# Patient Record
Sex: Male | Born: 1980 | Race: Black or African American | Hispanic: No | Marital: Married | Smoking: Never smoker
Health system: Southern US, Community
[De-identification: ages and names within clinical notes are randomized; demographics above are authoritative.]

## PROBLEM LIST (undated history)

## (undated) DIAGNOSIS — I10 Essential (primary) hypertension: Secondary | ICD-10-CM

---

## 2011-03-22 ENCOUNTER — Ambulatory Visit: Payer: Self-pay | Admitting: Internal Medicine

## 2011-03-22 DIAGNOSIS — Z0289 Encounter for other administrative examinations: Secondary | ICD-10-CM

## 2013-01-01 ENCOUNTER — Ambulatory Visit (INDEPENDENT_AMBULATORY_CARE_PROVIDER_SITE_OTHER): Payer: Managed Care, Other (non HMO) | Admitting: Emergency Medicine

## 2013-01-01 VITALS — BP 122/76 | HR 64 | Temp 98.7°F | Resp 18 | Ht 70.0 in | Wt 195.2 lb

## 2013-01-01 DIAGNOSIS — H1030 Unspecified acute conjunctivitis, unspecified eye: Secondary | ICD-10-CM

## 2013-01-01 DIAGNOSIS — J018 Other acute sinusitis: Secondary | ICD-10-CM

## 2013-01-01 MED ORDER — HYDROCOD POLST-CHLORPHEN POLST 10-8 MG/5ML PO LQCR: 5.0000 mL | Freq: Two times a day (BID) | ORAL | Status: AC | PRN

## 2013-01-01 MED ORDER — AMOXICILLIN-POT CLAVULANATE 875-125 MG PO TABS: 1.0000 | ORAL_TABLET | Freq: Two times a day (BID) | ORAL | Status: DC

## 2013-01-01 MED ORDER — PSEUDOEPHEDRINE-GUAIFENESIN ER 60-600 MG PO TB12: 1.0000 | ORAL_TABLET | Freq: Two times a day (BID) | ORAL | Status: AC

## 2013-01-01 MED ORDER — OFLOXACIN 0.3 % OP SOLN: 1.0000 [drp] | Freq: Four times a day (QID) | OPHTHALMIC | Status: AC

## 2013-01-01 NOTE — Progress Notes (Signed)
[facility] [address] [place] [address] [phone]  Date:  01/01/2013   Name:  Isiaah Cuervo Mirelez   DOB:  [birth year]   MRN:  [id number]  PCP:  Provider Not In System    Chief Complaint: Cough, Sinusitis, Nasal Congestion and Conjunctivitis   History of Present Illness:  TAIGA LUPINACCI is a 32 y.o. very pleasant male patient who presents with the following:  Ill for a week with purulent and often bloody nasal drainage.  Has post nasal drainage and a sore throat.  Cough is prominent at night and productive of scant sputum.  No fever or chills.  No nausea or vomiting.  No wheezing or shortness of breath.   Has bilateral draining red eyes.  No improvement with over the counter medications or other home remedies. Denies other complaint or health concern today.    There are no active problems to display for this patient.   History reviewed. No pertinent past medical history.  History reviewed. No pertinent past surgical history.  History  Substance Use Topics  . Smoking status: Never Smoker   . Smokeless tobacco: Not on file  . Alcohol Use: No    Family History  Problem Relation Age of Onset  . Heart disease Mother   . Hypertension Mother   . Stroke Mother     No Known Allergies  Medication list has been reviewed and updated.  No current outpatient prescriptions on file prior to visit.   No current facility-administered medications on file prior to visit.    Review of Systems:  As per HPI, otherwise negative.    Physical Examination: Filed Vitals:   01/01/13 1754  BP: 122/76  Pulse: 64  Temp: 98.7 F (37.1 C)  Resp: 18   Filed Vitals:   01/01/13 1754  Height: 5\' 10"  (1.778 m)  Weight: 195 lb 3.2 oz (88.542 kg)   Body mass index is 28.01 kg/(m^2). Ideal Body Weight: Weight in (lb) to have BMI = 25: 173.9  GEN: WDWN, NAD, Non-toxic, A & O x 3 HEENT: Atraumatic, Normocephalic. Neck supple. No masses, No LAD.   Bilateral pink eye Ears and Nose: No external deformity.  Green nasal drainage.   CV: RRR, No M/G/R. No JVD. No thrill. No extra heart sounds. PULM: CTA B, no wheezes, crackles, rhonchi. No retractions. No resp. distress. No accessory muscle use. ABD: S, NT, ND, +BS. No rebound. No HSM. EXTR: No c/c/e NEURO Normal gait.  PSYCH: Normally interactive. Conversant. Not depressed or anxious appearing.  Calm demeanor.    Assessment and Plan: Sinusitis Conjunctivitis augmentin mucinex tussionex Ocuflox   Signed,  Phillips Odor, MD

## 2013-01-01 NOTE — Patient Instructions (Signed)
Sinusitis Sinusitis is redness, soreness, and swelling (inflammation) of the paranasal sinuses. Paranasal sinuses are air pockets within the bones of your face (beneath the eyes, the middle of the forehead, or above the eyes). In healthy paranasal sinuses, mucus is able to drain out, and air is able to circulate through them by way of your nose. However, when your paranasal sinuses are inflamed, mucus and air can become trapped. This can allow bacteria and other germs to grow and cause infection. Sinusitis can develop quickly and last only a short time (acute) or continue over a long period (chronic). Sinusitis that lasts for more than 12 weeks is considered chronic.  CAUSES  Causes of sinusitis include:  Allergies.  Structural abnormalities, such as displacement of the cartilage that separates your nostrils (deviated septum), which can decrease the air flow through your nose and sinuses and affect sinus drainage.  Functional abnormalities, such as when the small hairs (cilia) that line your sinuses and help remove mucus do not work properly or are not present. SYMPTOMS  Symptoms of acute and chronic sinusitis are the same. The primary symptoms are pain and pressure around the affected sinuses. Other symptoms include:  Upper toothache.  Earache.  Headache.  Bad breath.  Decreased sense of smell and taste.  A cough, which worsens when you are lying flat.  Fatigue.  Fever.  Thick drainage from your nose, which often is green and may contain pus (purulent).  Swelling and warmth over the affected sinuses. DIAGNOSIS  Your caregiver will perform a physical exam. During the exam, your caregiver may:  Look in your nose for signs of abnormal growths in your nostrils (nasal polyps).  Tap over the affected sinus to check for signs of infection.  View the inside of your sinuses (endoscopy) with a special imaging device with a light attached (endoscope), which is inserted into your  sinuses. If your caregiver suspects that you have chronic sinusitis, one or more of the following tests may be recommended:  Allergy tests.  Nasal culture A sample of mucus is taken from your nose and sent to a lab and screened for bacteria.  Nasal cytology A sample of mucus is taken from your nose and examined by your caregiver to determine if your sinusitis is related to an allergy. TREATMENT  Most cases of acute sinusitis are related to a viral infection and will resolve on their own within 10 days. Sometimes medicines are prescribed to help relieve symptoms (pain medicine, decongestants, nasal steroid sprays, or saline sprays).  However, for sinusitis related to a bacterial infection, your caregiver will prescribe antibiotic medicines. These are medicines that will help kill the bacteria causing the infection.  Rarely, sinusitis is caused by a fungal infection. In theses cases, your caregiver will prescribe antifungal medicine. For some cases of chronic sinusitis, surgery is needed. Generally, these are cases in which sinusitis recurs more than 3 times per year, despite other treatments. HOME CARE INSTRUCTIONS   Drink plenty of water. Water helps thin the mucus so your sinuses can drain more easily.  Use a humidifier.  Inhale steam 3 to 4 times a day (for example, sit in the bathroom with the shower running).  Apply a warm, moist washcloth to your face 3 to 4 times a day, or as directed by your caregiver.  Use saline nasal sprays to help moisten and clean your sinuses.  Take over-the-counter or prescription medicines for pain, discomfort, or fever only as directed by your caregiver. SEEK IMMEDIATE MEDICAL   CARE IF:  You have increasing pain or severe headaches.  You have nausea, vomiting, or drowsiness.  You have swelling around your face.  You have vision problems.  You have a stiff neck.  You have difficulty breathing. MAKE SURE YOU:   Understand these  instructions.  Will watch your condition.  Will get help right away if you are not doing well or get worse. Document Released: 01/30/2005 Document Revised: 04/24/2011 Document Reviewed: 02/14/2011 ExitCare Patient Information 2014 [employer].   Conjunctivitis Conjunctivitis is commonly called "pink eye." Conjunctivitis can be caused by bacterial or viral infection, allergies, or injuries. There is usually redness of the lining of the eye, itching, discomfort, and sometimes discharge. There may be deposits of matter along the eyelids. A viral infection usually causes a watery discharge, while a bacterial infection causes a yellowish, thick discharge. Pink eye is very contagious and spreads by direct contact. You may be given antibiotic eyedrops as part of your treatment. Before using your eye medicine, remove all drainage from the eye by washing gently with warm water and cotton balls. Continue to use the medication until you have awakened 2 mornings in a row without discharge from the eye. Do not rub your eye. This increases the irritation and helps spread infection. Use separate towels from other household members. Wash your hands with soap and water before and after touching your eyes. Use cold compresses to reduce pain and sunglasses to relieve irritation from light. Do not wear contact lenses or wear eye makeup until the infection is gone. SEEK MEDICAL CARE IF:   Your symptoms are not better after 3 days of treatment.  You have increased pain or trouble seeing.  The outer eyelids become very red or swollen. Document Released: 03/09/2004 Document Revised: 04/24/2011 Document Reviewed: 01/30/2005 ExitCare Patient Information 2014 [employer].  

## 2016-03-06 DIAGNOSIS — J00 Acute nasopharyngitis [common cold]: Secondary | ICD-10-CM | POA: Diagnosis not present

## 2016-04-07 DIAGNOSIS — Z0000 Encounter for general adult medical examination without abnormal findings: Secondary | ICD-10-CM | POA: Diagnosis not present

## 2016-04-07 DIAGNOSIS — E559 Vitamin D deficiency, unspecified: Secondary | ICD-10-CM | POA: Diagnosis not present

## 2016-04-07 DIAGNOSIS — Z8639 Personal history of other endocrine, nutritional and metabolic disease: Secondary | ICD-10-CM | POA: Diagnosis not present

## 2016-05-31 DIAGNOSIS — Z20828 Contact with and (suspected) exposure to other viral communicable diseases: Secondary | ICD-10-CM | POA: Diagnosis not present

## 2016-08-24 DIAGNOSIS — W109XXA Fall (on) (from) unspecified stairs and steps, initial encounter: Secondary | ICD-10-CM | POA: Diagnosis not present

## 2016-08-24 DIAGNOSIS — M533 Sacrococcygeal disorders, not elsewhere classified: Secondary | ICD-10-CM | POA: Diagnosis not present

## 2016-08-24 DIAGNOSIS — S3992XA Unspecified injury of lower back, initial encounter: Secondary | ICD-10-CM | POA: Diagnosis not present

## 2017-03-25 DIAGNOSIS — J111 Influenza due to unidentified influenza virus with other respiratory manifestations: Secondary | ICD-10-CM | POA: Diagnosis not present

## 2017-04-05 DIAGNOSIS — I10 Essential (primary) hypertension: Secondary | ICD-10-CM | POA: Diagnosis not present

## 2017-05-01 DIAGNOSIS — I10 Essential (primary) hypertension: Secondary | ICD-10-CM | POA: Diagnosis not present

## 2017-11-20 DIAGNOSIS — I10 Essential (primary) hypertension: Secondary | ICD-10-CM | POA: Diagnosis not present

## 2018-03-18 DIAGNOSIS — I10 Essential (primary) hypertension: Secondary | ICD-10-CM | POA: Diagnosis not present

## 2018-04-17 DIAGNOSIS — R05 Cough: Secondary | ICD-10-CM | POA: Diagnosis not present

## 2018-04-17 DIAGNOSIS — J189 Pneumonia, unspecified organism: Secondary | ICD-10-CM | POA: Diagnosis not present

## 2018-04-17 DIAGNOSIS — B029 Zoster without complications: Secondary | ICD-10-CM | POA: Diagnosis not present

## 2018-04-22 DIAGNOSIS — B029 Zoster without complications: Secondary | ICD-10-CM | POA: Diagnosis not present

## 2018-04-22 DIAGNOSIS — J189 Pneumonia, unspecified organism: Secondary | ICD-10-CM | POA: Diagnosis not present

## 2018-05-06 DIAGNOSIS — J189 Pneumonia, unspecified organism: Secondary | ICD-10-CM | POA: Diagnosis not present

## 2018-12-01 ENCOUNTER — Encounter (HOSPITAL_COMMUNITY): Payer: Self-pay

## 2018-12-01 ENCOUNTER — Emergency Department (HOSPITAL_COMMUNITY): Payer: 59

## 2018-12-01 ENCOUNTER — Other Ambulatory Visit: Payer: Self-pay

## 2018-12-01 ENCOUNTER — Emergency Department (HOSPITAL_COMMUNITY)
Admission: EM | Admit: 2018-12-01 | Discharge: 2018-12-01 | Disposition: A | Discharge status: Home / Self Care | Payer: 59 | Attending: Emergency Medicine | Admitting: Emergency Medicine

## 2018-12-01 DIAGNOSIS — Z79899 Other long term (current) drug therapy: Secondary | ICD-10-CM | POA: Insufficient documentation

## 2018-12-01 DIAGNOSIS — I10 Essential (primary) hypertension: Secondary | ICD-10-CM | POA: Insufficient documentation

## 2018-12-01 DIAGNOSIS — R0789 Other chest pain: Secondary | ICD-10-CM | POA: Insufficient documentation

## 2018-12-01 DIAGNOSIS — R079 Chest pain, unspecified: Secondary | ICD-10-CM | POA: Diagnosis present

## 2018-12-01 HISTORY — DX: Essential (primary) hypertension: I10

## 2018-12-01 LAB — BASIC METABOLIC PANEL
Anion gap: 9 (ref 5–15)
BUN: 10 mg/dL (ref 6–20)
CO2: 28 mmol/L (ref 22–32)
Calcium: 9 mg/dL (ref 8.9–10.3)
Chloride: 103 mmol/L (ref 98–111)
Creatinine, Ser: 0.97 mg/dL (ref 0.61–1.24)
GFR calc Af Amer: >60 mL/min (ref >60)
GFR calc non Af Amer: >60 mL/min (ref >60)
Glucose, Bld: 100 mg/dL — ABNORMAL HIGH (ref 70–99)
Potassium: 3.5 mmol/L (ref 3.5–5.1)
Sodium: 140 mmol/L (ref 135–145)

## 2018-12-01 LAB — CBC
HCT: 47.5 % (ref 39.0–52.0)
Hemoglobin: 15.9 g/dL (ref 13.0–17.0)
MCH: 32.1 pg (ref 26.0–34.0)
MCHC: 33.5 g/dL (ref 30.0–36.0)
MCV: 96 fL (ref 80.0–100.0)
Platelets: 252 10*3/uL (ref 150–400)
RBC: 4.95 MIL/uL (ref 4.22–5.81)
RDW: 12.4 % (ref 11.5–15.5)
WBC: 4.2 10*3/uL (ref 4.0–10.5)
nRBC: 0 % (ref 0.0–0.2)

## 2018-12-01 LAB — TROPONIN I (HIGH SENSITIVITY)
Troponin I (High Sensitivity): 7 ng/L (ref <18)
Troponin I (High Sensitivity): 8 ng/L (ref <18)

## 2018-12-01 MED ORDER — SODIUM CHLORIDE 0.9% FLUSH: 3.0000 mL | Freq: Once | INTRAVENOUS | Status: DC

## 2018-12-01 MED ORDER — FAMOTIDINE 20 MG PO TABS: 20.0000 mg | ORAL_TABLET | Freq: Two times a day (BID) | ORAL | 0 refills | Status: AC

## 2018-12-01 NOTE — ED Triage Notes (Signed)
Patient complains of CP intermittently x 3 weeks. Sent from [place] walk-in clinic for further evaluation. No pain on arrival, reports some intermittent left arm tingling with same. HX of HTN.

## 2018-12-01 NOTE — Discharge Instructions (Addendum)
You have been diagnosed today with Atypical Chest Pain.  At this time there does not appear to be the presence of an emergent medical condition, however there is always the potential for conditions to change. Please read and follow the below instructions.  Please return to the Emergency Department immediately for any new or worsening symptoms. Please be sure to follow up with your Primary Care Provider within one week regarding your visit today; please call their office to schedule an appointment even if you are feeling better for a follow-up visit. You may take the medication Pepcid as prescribed to help with possible GERD.  Get help right away if: Your chest pain is worse. You have a cough that gets worse, or you cough up blood. You have very bad (severe) pain in your belly (abdomen). You pass out (faint). You have either of these for no clear reason: Sudden chest discomfort. Sudden discomfort in your arms, back, neck, or jaw. You have shortness of breath at any time. You suddenly start to sweat, or your skin gets clammy. You feel sick to your stomach (nauseous). You throw up (vomit). You suddenly feel lightheaded or dizzy. You feel very weak or tired. Your heart starts to beat fast, or it feels like it is skipping beats. You have any new/concerning or worsening symptoms  Please read the additional information packets attached to your discharge summary.  Do not take your medicine if  develop an itchy rash, swelling in your mouth or lips, or difficulty breathing; call 911 and seek immediate emergency medical attention if this occurs.  Note: Portions of this text may have been transcribed using voice recognition software. Every effort was made to ensure accuracy; however, inadvertent computerized transcription errors may still be present.

## 2018-12-01 NOTE — ED Provider Notes (Addendum)
[place] EMERGENCY DEPARTMENT Provider Note   CSN: [id number] Arrival date & time: 12/01/18  1022     History   Chief Complaint Chief Complaint  Patient presents with  . Chest Pain    HPI Noah Marsh is a 38 y.o. male history of hypertension compliant with daily losartan otherwise healthy presents today for chest pain.  Intermittent chest pain x3 weeks, occurs approximately every other day last episode of pain yesterday afternoon, duration 30 minutes, sharp aching left-sided moderate intensity with associated tingling of the left arm no clear aggravating or alleviating factors, no medications prior to arrival.  Sent in by [place] [facility] for concern of abnormal EKG.  Denies fever/chills, headache/vision changes, neck pain, shortness of breath, cough/hemoptysis, abdominal pain, nausea/vomiting, diarrhea, diaphoresis, fall/injury, extremity pain/swelling, history of blood clot, hormone use, history of cancer, recent surgery/immobilization or any additional concerns.    HPI  Past Medical History:  Diagnosis Date  . Hypertension     There are no active problems to display for this patient.   History reviewed. No pertinent surgical history.      Home Medications    Prior to Admission medications   Medication Sig Start Date End Date Taking? Authorizing Provider  losartan-hydrochlorothiazide (HYZAAR) 50-12.5 MG tablet Take 1 tablet by mouth daily. 09/18/18  Yes [provider]  Multiple Vitamin (THERA) TABS Take 1 tablet by mouth daily.   Yes [provider]  chlorpheniramine-HYDROcodone (TUSSIONEX PENNKINETIC ER) 10-8 MG/5ML LQCR Take 5 mLs by mouth every 12 (twelve) hours as needed. Patient not taking: Reported on 12/01/2018 01/01/13   Roselee Culver, MD  famotidine (PEPCID) 20 MG tablet Take 1 tablet (20 mg total) by mouth 2 (two) times daily. 12/01/18   Nuala Alpha A, PA-C  ofloxacin (OCUFLOX) 0.3 % ophthalmic solution  Place 1 drop into both eyes 4 (four) times daily. Patient not taking: Reported on 12/01/2018 01/01/13   Roselee Culver, MD    Family History Family History  Problem Relation Age of Onset  . Heart disease Mother   . Hypertension Mother   . Stroke Mother     Social History Social History   Tobacco Use  . Smoking status: Never Smoker  Substance Use Topics  . Alcohol use: No  . Drug use: No     Allergies   Patient has no known allergies.   Review of Systems Review of Systems Ten systems are reviewed and are negative for acute change except as noted in the HPI   Physical Exam Updated Vital Signs BP 131/69   Pulse 69   Temp 98.8 F (37.1 C)   Resp 12   SpO2 99%   Physical Exam Constitutional:      General: He is not in acute distress.    Appearance: Normal appearance. He is well-developed. He is not ill-appearing or diaphoretic.  HENT:     Head: Normocephalic and atraumatic.     Right Ear: External ear normal.     Left Ear: External ear normal.     Nose: Nose normal.  Eyes:     General: Vision grossly intact. Gaze aligned appropriately.     Pupils: Pupils are equal, round, and reactive to light.  Neck:     Musculoskeletal: Normal range of motion.     Trachea: Trachea and phonation normal. No tracheal deviation.  Cardiovascular:     Rate and Rhythm: Normal rate and regular rhythm.     Pulses:  Radial pulses are 2+ on the right side and 2+ on the left side.       Dorsalis pedis pulses are 2+ on the right side and 2+ on the left side.     Heart sounds: Normal heart sounds.  Pulmonary:     Effort: Pulmonary effort is normal. No respiratory distress.     Breath sounds: Normal breath sounds.  Chest:     Chest wall: No deformity, tenderness or crepitus.  Abdominal:     General: There is no distension.     Palpations: Abdomen is soft.     Tenderness: There is no abdominal tenderness. There is no guarding or rebound.  Musculoskeletal: Normal range  of motion.     Right lower leg: He exhibits no tenderness. No edema.     Left lower leg: He exhibits no tenderness. No edema.  Skin:    General: Skin is warm and dry.  Neurological:     Mental Status: He is alert.     GCS: GCS eye subscore is 4. GCS verbal subscore is 5. GCS motor subscore is 6.     Comments: Speech is clear and goal oriented, follows commands Major Cranial nerves without deficit, no facial droop Moves extremities without ataxia, coordination intact  Psychiatric:        Behavior: Behavior normal.      ED Treatments / Results  Labs (all labs ordered are listed, but only abnormal results are displayed) Labs Reviewed  BASIC METABOLIC PANEL - Abnormal; Notable for the following components:      Result Value   Glucose, Bld 100 (*)    All other components within normal limits  CBC  TROPONIN I (HIGH SENSITIVITY)  TROPONIN I (HIGH SENSITIVITY)    EKG EKG Interpretation  Date/Time:  Sunday December 01 2018 10:31:45 EDT Ventricular Rate:  61 PR Interval:  154 QRS Duration: 106 QT Interval:  386 QTC Calculation: 388 R Axis:   70 Text Interpretation:  Normal sinus rhythm with sinus arrhythmia Normal ECG Confirmed by Raeford RazorKohut, Stephen [phone]) on 12/01/2018 11:07:26 AM   Radiology Dg Chest 2 View  Result Date: 12/01/2018 CLINICAL DATA:  Intermittent chest pain x3 weeks EXAM: CHEST - 2 VIEW COMPARISON:  05/06/2018 FINDINGS: Lungs are clear. No pleural effusion or pneumothorax. The heart is normal in size. Visualized osseous structures are within normal limits. IMPRESSION: Normal chest radiographs. Electronically Signed   By: Charline BillsSriyesh  Krishnan M.D.   On: 12/01/2018 11:35    Procedures Procedures (including critical care time)  Medications Ordered in ED Medications  sodium chloride flush (NS) 0.9 % injection 3 mL (has no administration in time range)     Initial Impression / Assessment and Plan / ED Course  I have reviewed the triage vital signs and the nursing  notes.  Pertinent labs & imaging results that were available during my care of the patient were reviewed by me and considered in my medical decision making (see chart for details).    EKG from urgent care reviewed with Dr. Juleen ChinaKohut, low voltage. - Patient well-appearing no acute distress.  No active chest pain, last episode yesterday afternoon.  Heart regular rate and rhythm without murmur, lungs clear to auscultation bilaterally, abdomen soft nontender without peritoneal signs, Murphy sign negative, neurovascular intact to all 4 extremities without sign of DVT, low risk by Wells criteria and PERC negative. - CBC with normal limits BMP nonacute High-sensitivity troponin: 7 Chest x-ray:  IMPRESSION:  Normal chest radiographs.   EKG: Normal  sinus rhythm with sinus arrhythmia Normal ECG Confirmed by Raeford Razor [phone]) on 12/01/2018 11:07:26 AM - [facility] Troponin: 8  Heart Score < 4 - Patient reassessed resting comfortably no acute distress.  Remains chest pain-free.  Updated on work-up today and states understanding.  He is to follow-up with PCP.  Will start patient on Pepcid today for possible GI etiology of symptoms.  With reassuring work-up and physical examination today doubt ACS, PE, dissection or other acute cardiopulmonary etiology of patient's symptoms today.  Additionally with benign abdominal examination doubt cholecystitis, AAA, diverticulitis, SBO or any acute intra-abdominal etiology of his symptoms today.  At this time there does not appear to be any evidence of an acute emergency medical condition and the patient appears stable for discharge with appropriate outpatient follow up. Diagnosis was discussed with patient who verbalizes understanding of care plan and is agreeable to discharge. I have discussed return precautions with patient who verbalizes understanding of return precautions. Patient encouraged to follow-up with their PCP. All questions answered. Patient has been  discharged in good condition.  Patient's case discussed with Dr. Juleen China who agrees with plan to discharge with follow-up.   Note: Portions of this report may have been transcribed using voice recognition software. Every effort was made to ensure accuracy; however, inadvertent computerized transcription errors may still be present. Final Clinical Impressions(s) / ED Diagnoses   Final diagnoses:  Atypical chest pain    ED Discharge Orders         Ordered    famotidine (PEPCID) 20 MG tablet  2 times daily     12/01/18 1555           Elizabeth Palau 12/01/18 1603    Raeford Razor, MD 12/02/18 [phone]

## 2020-03-02 IMAGING — DX DG CHEST 2V
2 series · 2 of 2 positions shown · non-contrast
Comparison: 05/06/2018

CLINICAL DATA: Intermittent chest pain x3 weeks

EXAM:
CHEST - 2 VIEW

[chest pa]
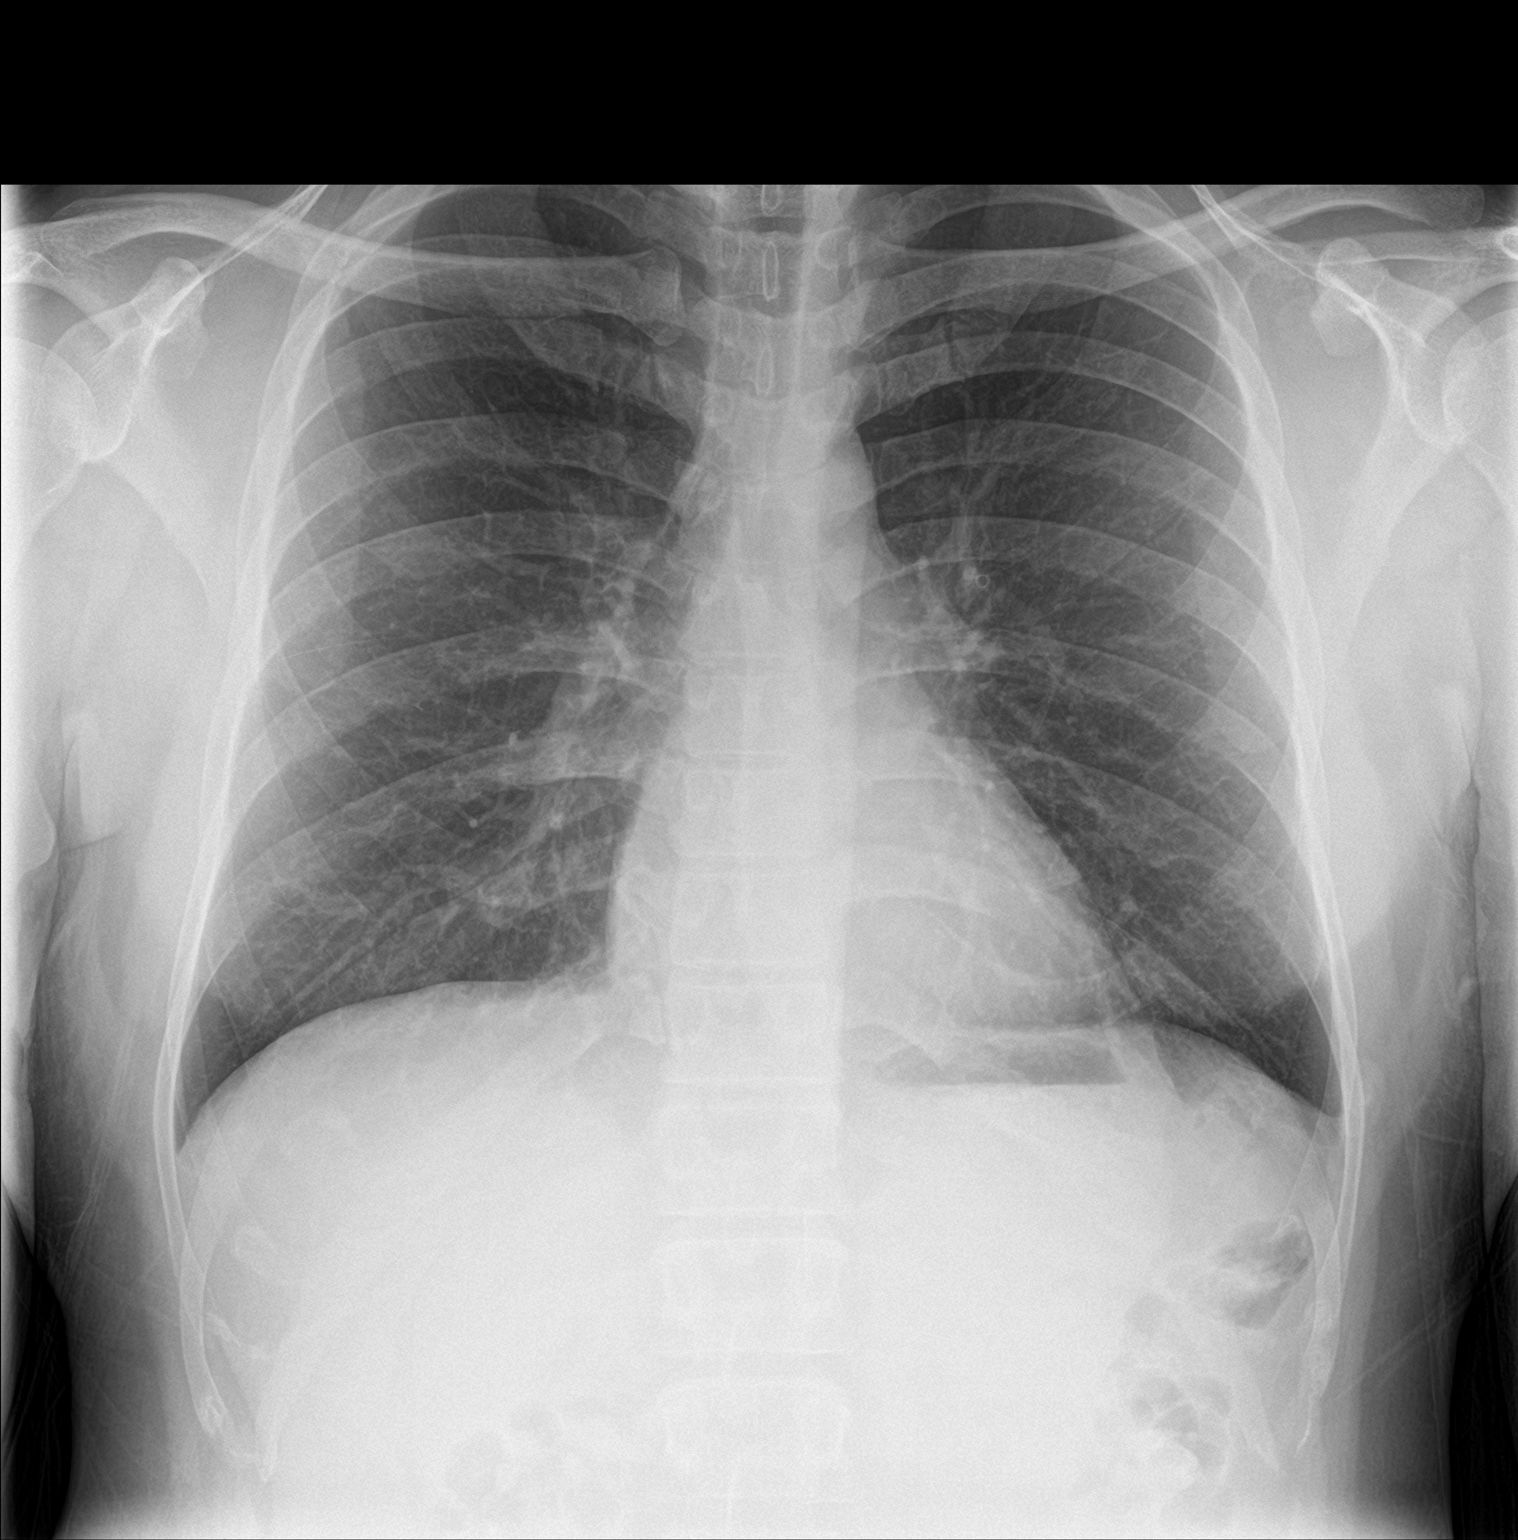

[chest lat]
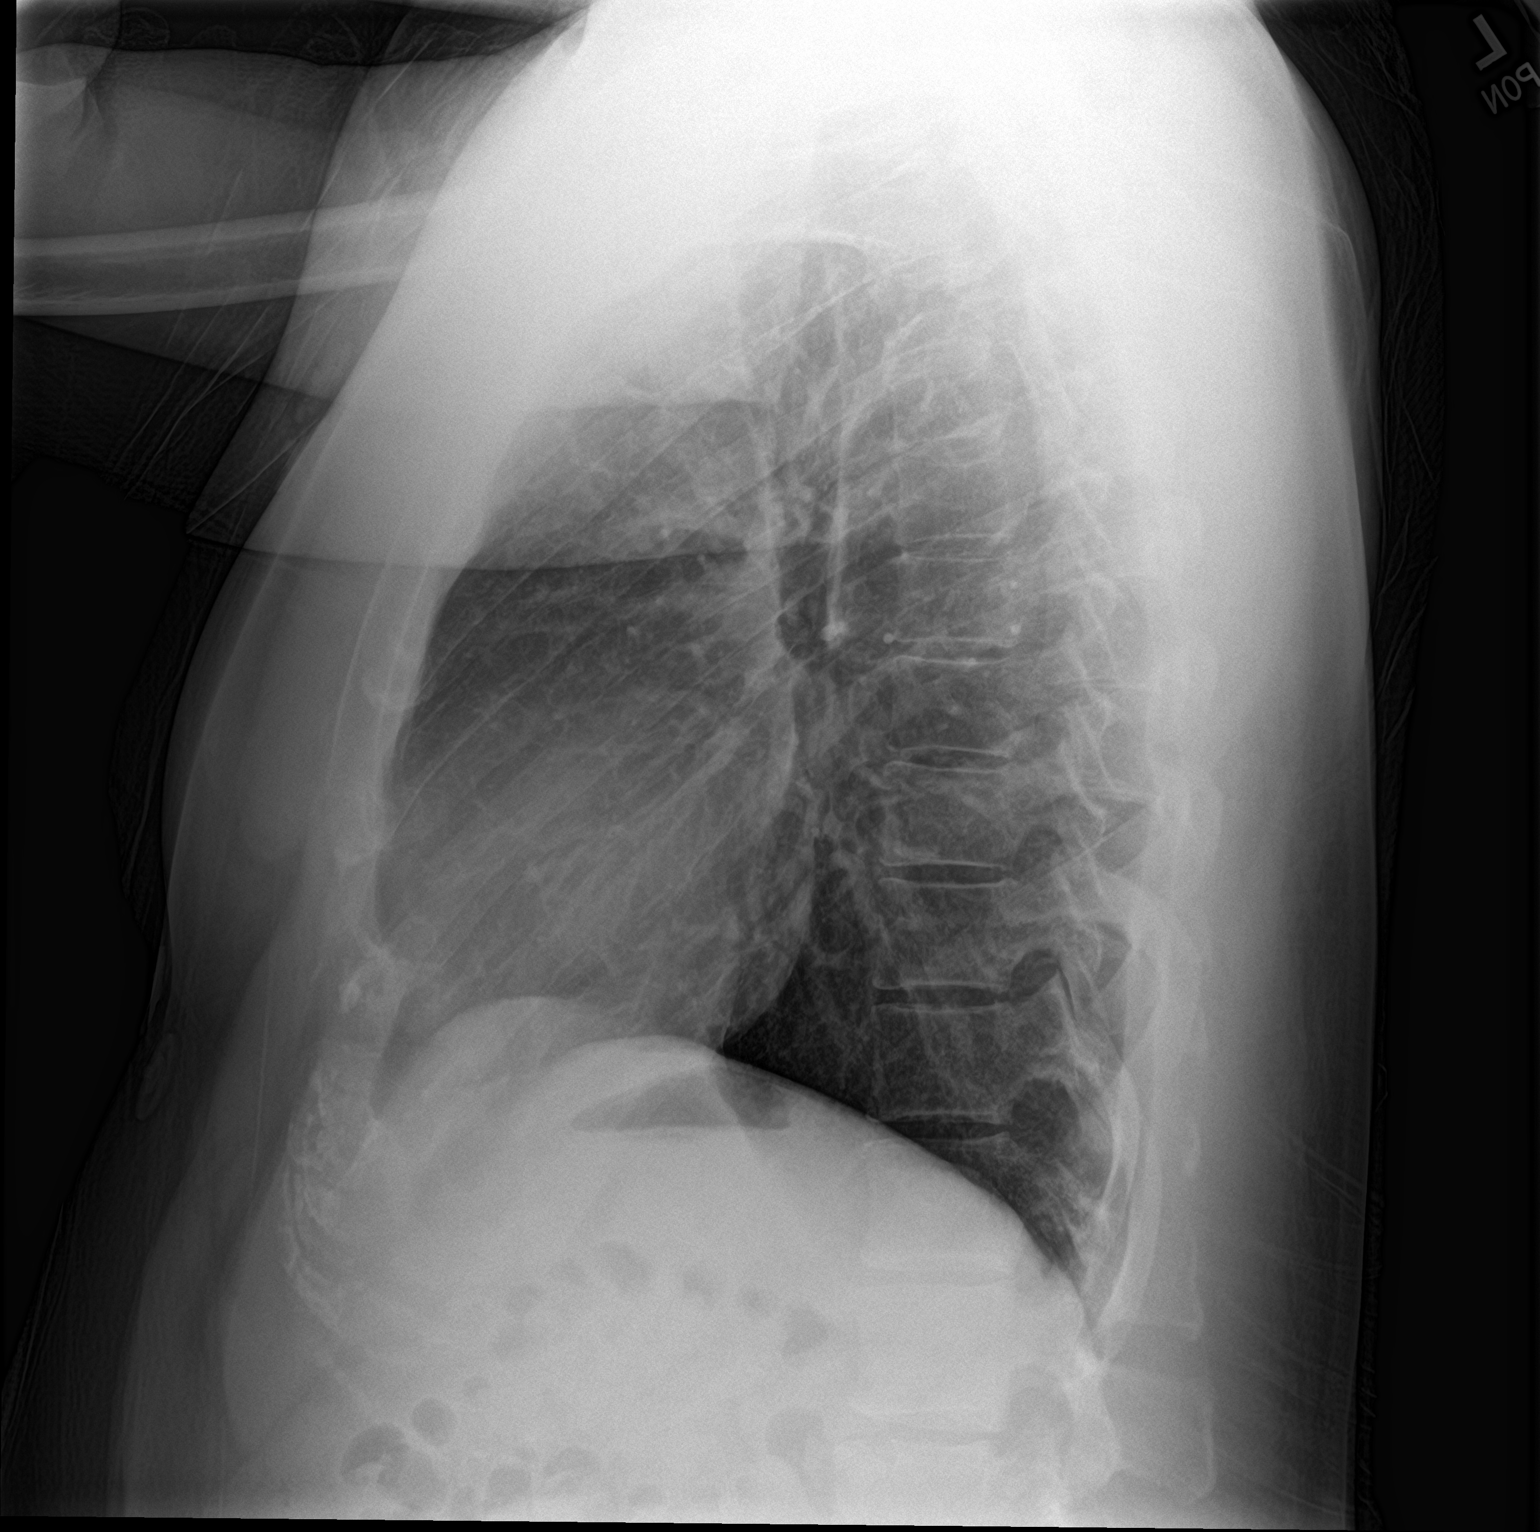

[2 of 2 positions shown; findings below may reference images not displayed]

FINDINGS: Lungs are clear. No pleural effusion or pneumothorax.

The heart is normal in size.

Visualized osseous structures are within normal limits.
IMPRESSION: Normal chest radiographs.

## 2024-02-11 ENCOUNTER — Encounter (HOSPITAL_BASED_OUTPATIENT_CLINIC_OR_DEPARTMENT_OTHER): Payer: Self-pay | Admitting: *Deleted

## 2024-02-11 ENCOUNTER — Emergency Department (HOSPITAL_BASED_OUTPATIENT_CLINIC_OR_DEPARTMENT_OTHER)
Admission: EM | Admit: 2024-02-11 | Discharge: 2024-02-11 | Disposition: A | Discharge status: Home / Self Care | Attending: Emergency Medicine | Admitting: Emergency Medicine

## 2024-02-11 ENCOUNTER — Emergency Department (HOSPITAL_BASED_OUTPATIENT_CLINIC_OR_DEPARTMENT_OTHER): Admitting: Radiology

## 2024-02-11 ENCOUNTER — Other Ambulatory Visit: Payer: Self-pay

## 2024-02-11 DIAGNOSIS — J069 Acute upper respiratory infection, unspecified: Secondary | ICD-10-CM | POA: Insufficient documentation

## 2024-02-11 DIAGNOSIS — R059 Cough, unspecified: Secondary | ICD-10-CM | POA: Diagnosis present

## 2024-02-11 MED ORDER — IPRATROPIUM-ALBUTEROL 0.5-2.5 (3) MG/3ML IN SOLN
3.0000 mL | Freq: Once | RESPIRATORY_TRACT | Status: AC
  Administered 2024-02-11: 3 mL via RESPIRATORY_TRACT
  Filled 2024-02-11: qty 3

## 2024-02-11 MED ORDER — DOXYCYCLINE HYCLATE 100 MG PO CAPS: 100.0000 mg | ORAL_CAPSULE | Freq: Two times a day (BID) | ORAL | 0 refills | Status: AC

## 2024-02-11 MED ORDER — BENZONATATE 100 MG PO CAPS: 100.0000 mg | ORAL_CAPSULE | Freq: Three times a day (TID) | ORAL | 0 refills | Status: AC

## 2024-02-11 MED ORDER — ALBUTEROL SULFATE HFA 108 (90 BASE) MCG/ACT IN AERS
2.0000 | INHALATION_SPRAY | RESPIRATORY_TRACT | Status: DC | PRN
  Administered 2024-02-11: 2 via RESPIRATORY_TRACT
  Filled 2024-02-11: qty 6.7

## 2024-02-11 NOTE — ED Provider Notes (Signed)
 " [facility] Emergency Department Provider Note MRN:  [id number]  Arrival date & time: 02/11/2024     Chief Complaint   URI   History of Present Illness   Noah Marsh is a 43 y.o. year-old male presents to the ED with chief complaint of cough, SOB x 10 days.  Reports flu exposures.  States that he wants to make sure he doesn't have pneumonia.  Denies fevers or chills.  States that he felt a little better today, but had some SOB when he lies down.  History provided by patient.   Review of Systems  Pertinent positive and negative review of systems noted in HPI.    Physical Exam   Vitals:   02/11/24 0246  BP: 137/86  Pulse: 78  Resp: 19  Temp: (!) 97.3 F (36.3 C)  SpO2: 94%    CONSTITUTIONAL:  non toxic-appearing, NAD NEURO:  Alert and oriented x 3, CN 3-12 grossly intact EYES:  eyes equal and reactive ENT/NECK:  Supple, no stridor  CARDIO:  normal rate, regular rhythm, appears well-perfused  PULM:  No respiratory distress, faint end expiratory wheezes GI/GU:  non-distended,  MSK/SPINE:  No gross deformities, no edema, moves all extremities  SKIN:  no rash,  atraumatic   *Additional and/or pertinent findings included in MDM below  Diagnostic and Interventional Summary    EKG Interpretation Date/Time:    Ventricular Rate:    PR Interval:    QRS Duration:    QT Interval:    QTC Calculation:   R Axis:      Text Interpretation:         Labs Reviewed - No data to display  DG Chest 2 View  Final Result      Medications  albuterol  (VENTOLIN  HFA) 108 (90 Base) MCG/ACT inhaler 2 puff (has no administration in time range)  ipratropium-albuterol  (DUONEB) 0.5-2.5 (3) MG/3ML nebulizer solution 3 mL (3 mLs Nebulization Given 02/11/24 0313)     Procedures  /  Critical Care Procedures  ED Course and Medical Decision Making  I have reviewed the triage vital signs, the nursing notes, and pertinent available records from the  EMR.  Social Determinants Affecting Complexity of Care: Patient has no clinically significant social determinants affecting this chief complaint..   ED Course:    Medical Decision Making Patient here with cough x 10 days.  Has some mild wheezing.  Will give neb and check CXR.  Patient wants to rule out CAP.  His dad had recent kidney transplant and wants to know if he is contagious.  CXR negative for CAP.  Lungs/breathing improved after neb.  Advised staying away from immunocompromised patients until symptoms free.  Amount and/or Complexity of Data Reviewed Radiology: ordered.  Risk Prescription drug management.         Consultants: No consultations were needed in caring for this patient.   Treatment and Plan: Emergency department workup does not suggest an emergent condition requiring admission or immediate intervention beyond  what has been performed at this time. The patient is safe for discharge and has  been instructed to return immediately for worsening symptoms, change in  symptoms or any other concerns    Final Clinical Impressions(s) / ED Diagnoses     ICD-10-CM   1. Upper respiratory tract infection, unspecified type  J06.9       ED Discharge Orders          Ordered    doxycycline (VIBRAMYCIN) 100 MG capsule  2 times daily        02/11/24 0401    benzonatate (TESSALON) 100 MG capsule  Every 8 hours        02/11/24 0401              Discharge Instructions Discussed with and Provided to Patient:   Discharge Instructions   None      Vicky Charleston, PA-C 02/11/24 0406    Mesner, Selinda, MD 02/11/24 [phone]  "

## 2024-02-11 NOTE — ED Triage Notes (Signed)
 Pt has been having URI with cough and congestion and headaches for over a week.  Pt was exposed to someone that was positive for the flu.
